# Patient Record
Sex: Male | Born: 1970 | Race: Black or African American | Hispanic: No | Marital: Married | Smoking: Current every day smoker
Health system: Southern US, Community
[De-identification: ages and names within clinical notes are randomized; demographics above are authoritative.]

## PROBLEM LIST (undated history)

## (undated) DIAGNOSIS — E119 Type 2 diabetes mellitus without complications: Secondary | ICD-10-CM

## (undated) DIAGNOSIS — I10 Essential (primary) hypertension: Secondary | ICD-10-CM

## (undated) DIAGNOSIS — E785 Hyperlipidemia, unspecified: Secondary | ICD-10-CM

## (undated) DIAGNOSIS — R0981 Nasal congestion: Secondary | ICD-10-CM

## (undated) HISTORY — DX: Nasal congestion: R09.81

## (undated) HISTORY — DX: Hyperlipidemia, unspecified: E78.5

## (undated) HISTORY — DX: Type 2 diabetes mellitus without complications: E11.9

---

## 2011-10-31 ENCOUNTER — Encounter: Payer: Self-pay | Admitting: *Deleted

## 2011-10-31 ENCOUNTER — Emergency Department (INDEPENDENT_AMBULATORY_CARE_PROVIDER_SITE_OTHER)
Admission: EM | Admit: 2011-10-31 | Discharge: 2011-10-31 | Disposition: A | Discharge status: Home / Self Care | Payer: BC Managed Care – PPO | Source: Home / Self Care | Attending: Family Medicine | Admitting: Family Medicine

## 2011-10-31 DIAGNOSIS — L918 Other hypertrophic disorders of the skin: Secondary | ICD-10-CM

## 2011-10-31 HISTORY — DX: Essential (primary) hypertension: I10

## 2011-10-31 MED ORDER — MUPIROCIN CALCIUM 2 % EX CREA: TOPICAL_CREAM | Freq: Three times a day (TID) | CUTANEOUS | Status: AC

## 2011-10-31 NOTE — ED Notes (Signed)
Pt  Reports  Symptoms  Of  A  Sore     On  r  Arm         X  Several  Months     Getting  Worse  -  The  pts   Says  It  Started  Off small  And  Became  Bigger

## 2011-10-31 NOTE — ED Provider Notes (Addendum)
History     CSN: [id number]  Arrival date & time 10/31/11  1204   First MD Initiated Contact with Patient 10/31/11 1332      Chief Complaint  Patient presents with  . Sore    (Consider location/radiation/quality/duration/timing/severity/associated sxs/prior treatment) Patient is a 41 y.o. male presenting with abscess. The history is provided by the patient.  Abscess  This is a chronic problem. Episode onset: 3 mos ago. The onset was gradual. The problem has been gradually worsening. The abscess is present on the right arm. The problem is moderate. The abscess is characterized by draining, redness and painfulness.    Past Medical History  Diagnosis Date  . Hypertension     History reviewed. No pertinent past surgical history.  History reviewed. No pertinent family history.  History  Substance Use Topics  . Smoking status: Current Everyday Smoker  . Smokeless tobacco: Not on file  . Alcohol Use: Yes      Review of Systems  Constitutional: Negative.   Skin: Positive for rash.    Allergies  Review of patient's allergies indicates not on file.  Home Medications   Current Outpatient Rx  Name Route Sig Dispense Refill  . AMLODIPINE BESY-BENAZEPRIL HCL 5-20 MG PO CAPS Oral Take 1 capsule by mouth daily.      Marland Kitchen MUPIROCIN CALCIUM 2 % EX CREA Topical Apply topically 3 (three) times daily. 15 g 0    BP 126/96  Pulse 102  Temp(Src) 97.9 F (36.6 C) (Oral)  Resp 16  SpO2 98%  Physical Exam  Nursing note and vitals reviewed. Constitutional: He appears well-developed and well-nourished.  Skin:       ED Course  Procedures (including critical care time)  Labs Reviewed - No data to display No results found.   1. Hypertrophic granulation tissue       MDM          Barkley Bruns, MD 10/31/11 1441  Barkley Bruns, MD 10/31/11 [phone]

## 2011-11-03 ENCOUNTER — Ambulatory Visit (INDEPENDENT_AMBULATORY_CARE_PROVIDER_SITE_OTHER): Payer: BC Managed Care – PPO | Admitting: Surgery

## 2011-11-03 ENCOUNTER — Encounter (INDEPENDENT_AMBULATORY_CARE_PROVIDER_SITE_OTHER): Payer: Self-pay | Admitting: Surgery

## 2011-11-03 VITALS — BP 142/96 | HR 102 | Temp 97.4°F | Ht 71.0 in | Wt 244.4 lb

## 2011-11-03 DIAGNOSIS — R229 Localized swelling, mass and lump, unspecified: Secondary | ICD-10-CM

## 2011-11-03 NOTE — Progress Notes (Signed)
Chief Complaint  Patient presents with  . Mass    mass right antecubital fossa x 3 months    HISTORY:  Patient is a 41 year old black male who presents with a mass on the skin of the lateral right antecubital fossa for approximately 3 months. This has gradually increased in size. It has become ulcerated. He desires surgical excision. Patient denies any other such lesions. He has had no prior such lesions removed. He denies any history of chronic infections.  Past Medical History  Diagnosis Date  . Hypertension   . Nasal congestion      Current Outpatient Prescriptions  Medication Sig Dispense Refill  . amLODipine-benazepril (LOTREL) 5-20 MG per capsule Take 1 capsule by mouth daily.        . cyclobenzaprine (FLEXERIL) 10 MG tablet Take 10 mg by mouth as needed.        . mupirocin cream (BACTROBAN) 2 % Apply topically 3 (three) times daily.  15 g  0     No Known Allergies   History reviewed. No pertinent family history.   History   Social History  . Marital Status: Single    Spouse Name: N/A    Number of Children: N/A  . Years of Education: N/A   Social History Main Topics  . Smoking status: Current Everyday Smoker  . Smokeless tobacco: None  . Alcohol Use: Yes  . Drug Use: No  . Sexually Active:    Other Topics Concern  . None   Social History Narrative  . None     REVIEW OF SYSTEMS - PERTINENT POSITIVES ONLY: No pain. No drainage. Gradual enlargement.   EXAM: Filed Vitals:   11/03/11 1540  BP: 142/96  Pulse: 102  Temp: 97.4 F (36.3 C)    HEENT: normocephalic; pupils equal and reactive; sclerae clear; dentition good; mucous membranes moist NECK:  No nodules; symmetric on extension; no palpable anterior or posterior cervical lymphadenopathy; no supraclavicular masses; no tenderness CHEST: clear to auscultation bilaterally without rales, rhonchi, or wheezes CARDIAC: regular rate and rhythm without significant murmur; peripheral pulses are  full EXT:  non-tender without edema; no deformity; 2 cm ulcerated mass lateral anterior right antecubital fossa NEURO: no gross focal deficits; no sign of tremor   LABORATORY RESULTS: See E-Chart for most recent results   RADIOLOGY RESULTS: See E-Chart or I-Site for most recent results   IMPRESSION: Skin and subcutaneous mass, 2 cm, lateral right anterior antecubital fossa  PLAN: The patient and I discussed this lesion. This may be some type of a typical epidermal inclusion cyst. However I cannot rule out cutaneous malignancy. I have recommended excision under sedation and local anesthesia. We will schedule this in the near future.  The risks and benefits of the procedure have been discussed at length with the patient.  The patient understands the proposed procedure, potential alternative treatments, and the course of recovery to be expected.  All of the patient's questions have been answered at this time.  The patient wishes to proceed with surgery and will schedule a date for their procedure through our office staff.   Velora Heckler, MD, FACS General & Endocrine Surgery [facility]   Visit Diagnoses: 1. Localized skin mass, lump, or swelling     Primary Care Physician: Provider Not In System

## 2011-12-18 ENCOUNTER — Telehealth (INDEPENDENT_AMBULATORY_CARE_PROVIDER_SITE_OTHER): Payer: Self-pay

## 2011-12-18 DIAGNOSIS — C44621 Squamous cell carcinoma of skin of unspecified upper limb, including shoulder: Secondary | ICD-10-CM

## 2011-12-18 HISTORY — PX: MASS EXCISION: SHX2000

## 2011-12-18 NOTE — Telephone Encounter (Signed)
Left message - Post op appointment 12/29/2011 with an 8:30am arrival.

## 2011-12-19 ENCOUNTER — Telehealth (INDEPENDENT_AMBULATORY_CARE_PROVIDER_SITE_OTHER): Payer: Self-pay

## 2011-12-19 NOTE — Telephone Encounter (Signed)
Appointment in left on voicemail.

## 2011-12-29 ENCOUNTER — Ambulatory Visit (INDEPENDENT_AMBULATORY_CARE_PROVIDER_SITE_OTHER): Payer: BC Managed Care – PPO | Admitting: Surgery

## 2011-12-29 ENCOUNTER — Encounter (INDEPENDENT_AMBULATORY_CARE_PROVIDER_SITE_OTHER): Payer: Self-pay | Admitting: Surgery

## 2011-12-29 DIAGNOSIS — C44621 Squamous cell carcinoma of skin of unspecified upper limb, including shoulder: Secondary | ICD-10-CM

## 2011-12-29 DIAGNOSIS — R229 Localized swelling, mass and lump, unspecified: Secondary | ICD-10-CM

## 2011-12-29 NOTE — Progress Notes (Signed)
Visit Diagnoses: 1. Localized skin mass, lump, or swelling   2. Squamous cell cancer of skin of forearm     HISTORY: The patient returns for first postoperative visit having undergone excision of a cutaneous lesion from the right antecubital fossa. Final pathology shows a well differentiated squamous cell carcinoma with negative margins.  EXAM: Wound is healing uneventfully. Sutures are removed. Benzoin and Steri-Strips are applied. There is no sign of infection.  IMPRESSION: Well differentiated squamous cell carcinoma  PLAN: Pathology results are shared with the patient. These are certainly a surprise. I will review them with one of the dermatologists to see if any further evaluation is indicated. Given the fact that the tumor is well differentiated and that the margins are negative, he may require only close followup.  Patient will return in 6 weeks for wound check.  Velora Heckler, MD, FACS General & Endocrine Surgery [facility]

## 2011-12-29 NOTE — Patient Instructions (Signed)
After steristrips come off:   COCOA BUTTER & VITAMIN E CREAM  ([employer] or other brand)  Apply cocoa butter/vitamin E cream to your incision 2 - 3 times daily.  Massage cream into incision for one minute with each application.  Use sunscreen (50 SPF or higher) for first 6 months after surgery if area is exposed to sun.  You may substitute Mederma or other scar reducing creams as desired.

## 2012-01-01 ENCOUNTER — Encounter (INDEPENDENT_AMBULATORY_CARE_PROVIDER_SITE_OTHER): Payer: Self-pay | Admitting: Surgery

## 2012-02-16 ENCOUNTER — Encounter (INDEPENDENT_AMBULATORY_CARE_PROVIDER_SITE_OTHER): Payer: BC Managed Care – PPO | Admitting: Surgery

## 2012-02-25 ENCOUNTER — Encounter (INDEPENDENT_AMBULATORY_CARE_PROVIDER_SITE_OTHER): Payer: Self-pay | Admitting: Surgery

## 2012-04-15 ENCOUNTER — Encounter (INDEPENDENT_AMBULATORY_CARE_PROVIDER_SITE_OTHER): Payer: Self-pay

## 2013-02-15 ENCOUNTER — Other Ambulatory Visit: Payer: Self-pay | Admitting: Neurosurgery

## 2013-02-15 DIAGNOSIS — M545 Low back pain, unspecified: Secondary | ICD-10-CM

## 2013-02-17 ENCOUNTER — Ambulatory Visit
Admission: RE | Admit: 2013-02-17 | Discharge: 2013-02-17 | Disposition: A | Discharge status: Home / Self Care | Payer: BC Managed Care – PPO | Source: Ambulatory Visit | Attending: Neurosurgery | Admitting: Neurosurgery

## 2013-02-17 DIAGNOSIS — M545 Low back pain, unspecified: Secondary | ICD-10-CM

## 2014-12-14 ENCOUNTER — Other Ambulatory Visit: Payer: Self-pay | Admitting: Family Medicine

## 2014-12-15 ENCOUNTER — Other Ambulatory Visit: Payer: Self-pay | Admitting: Family Medicine

## 2014-12-15 DIAGNOSIS — L97509 Non-pressure chronic ulcer of other part of unspecified foot with unspecified severity: Secondary | ICD-10-CM

## 2014-12-22 ENCOUNTER — Ambulatory Visit
Admission: RE | Admit: 2014-12-22 | Discharge: 2014-12-22 | Disposition: A | Discharge status: Home / Self Care | Payer: BLUE CROSS/BLUE SHIELD | Source: Ambulatory Visit | Attending: Family Medicine | Admitting: Family Medicine

## 2014-12-22 DIAGNOSIS — L97509 Non-pressure chronic ulcer of other part of unspecified foot with unspecified severity: Secondary | ICD-10-CM

## 2015-01-03 ENCOUNTER — Encounter (HOSPITAL_BASED_OUTPATIENT_CLINIC_OR_DEPARTMENT_OTHER): Payer: Self-pay | Attending: Surgery

## 2017-01-01 DIAGNOSIS — B009 Herpesviral infection, unspecified: Secondary | ICD-10-CM | POA: Diagnosis not present

## 2017-01-01 DIAGNOSIS — I10 Essential (primary) hypertension: Secondary | ICD-10-CM | POA: Diagnosis not present

## 2017-01-01 DIAGNOSIS — F17200 Nicotine dependence, unspecified, uncomplicated: Secondary | ICD-10-CM | POA: Diagnosis not present

## 2017-04-06 DIAGNOSIS — I10 Essential (primary) hypertension: Secondary | ICD-10-CM | POA: Diagnosis not present

## 2017-04-06 DIAGNOSIS — R0781 Pleurodynia: Secondary | ICD-10-CM | POA: Diagnosis not present

## 2017-04-06 DIAGNOSIS — F17210 Nicotine dependence, cigarettes, uncomplicated: Secondary | ICD-10-CM | POA: Diagnosis not present

## 2017-06-08 DIAGNOSIS — F17210 Nicotine dependence, cigarettes, uncomplicated: Secondary | ICD-10-CM | POA: Diagnosis not present

## 2017-06-08 DIAGNOSIS — I10 Essential (primary) hypertension: Secondary | ICD-10-CM | POA: Diagnosis not present

## 2017-11-30 DIAGNOSIS — Z125 Encounter for screening for malignant neoplasm of prostate: Secondary | ICD-10-CM | POA: Diagnosis not present

## 2017-11-30 DIAGNOSIS — R0982 Postnasal drip: Secondary | ICD-10-CM | POA: Diagnosis not present

## 2017-11-30 DIAGNOSIS — F17210 Nicotine dependence, cigarettes, uncomplicated: Secondary | ICD-10-CM | POA: Diagnosis not present

## 2017-11-30 DIAGNOSIS — Z13220 Encounter for screening for lipoid disorders: Secondary | ICD-10-CM | POA: Diagnosis not present

## 2017-11-30 DIAGNOSIS — R400 Somnolence: Secondary | ICD-10-CM | POA: Diagnosis not present

## 2017-11-30 DIAGNOSIS — I10 Essential (primary) hypertension: Secondary | ICD-10-CM | POA: Diagnosis not present

## 2018-01-08 DIAGNOSIS — Z0000 Encounter for general adult medical examination without abnormal findings: Secondary | ICD-10-CM | POA: Diagnosis not present

## 2018-02-03 DIAGNOSIS — R0683 Snoring: Secondary | ICD-10-CM | POA: Diagnosis not present

## 2018-02-03 DIAGNOSIS — G4719 Other hypersomnia: Secondary | ICD-10-CM | POA: Diagnosis not present

## 2018-02-03 DIAGNOSIS — R0681 Apnea, not elsewhere classified: Secondary | ICD-10-CM | POA: Diagnosis not present

## 2018-03-15 DIAGNOSIS — M25512 Pain in left shoulder: Secondary | ICD-10-CM | POA: Diagnosis not present

## 2018-03-15 DIAGNOSIS — M542 Cervicalgia: Secondary | ICD-10-CM | POA: Diagnosis not present

## 2018-03-15 DIAGNOSIS — G4733 Obstructive sleep apnea (adult) (pediatric): Secondary | ICD-10-CM | POA: Diagnosis not present

## 2018-03-30 DIAGNOSIS — M542 Cervicalgia: Secondary | ICD-10-CM | POA: Diagnosis not present

## 2018-04-22 DIAGNOSIS — M5412 Radiculopathy, cervical region: Secondary | ICD-10-CM | POA: Diagnosis not present

## 2018-04-22 DIAGNOSIS — M542 Cervicalgia: Secondary | ICD-10-CM | POA: Diagnosis not present

## 2018-04-23 DIAGNOSIS — M4712 Other spondylosis with myelopathy, cervical region: Secondary | ICD-10-CM | POA: Diagnosis not present

## 2018-04-26 ENCOUNTER — Other Ambulatory Visit: Payer: Self-pay | Admitting: Family Medicine

## 2018-04-26 DIAGNOSIS — G959 Disease of spinal cord, unspecified: Secondary | ICD-10-CM

## 2018-05-01 ENCOUNTER — Ambulatory Visit
Admission: RE | Admit: 2018-05-01 | Discharge: 2018-05-01 | Disposition: A | Discharge status: Home / Self Care | Payer: BLUE CROSS/BLUE SHIELD | Source: Ambulatory Visit | Attending: Family Medicine | Admitting: Family Medicine

## 2018-05-01 DIAGNOSIS — M50121 Cervical disc disorder at C4-C5 level with radiculopathy: Secondary | ICD-10-CM | POA: Diagnosis not present

## 2018-05-01 DIAGNOSIS — G959 Disease of spinal cord, unspecified: Secondary | ICD-10-CM

## 2018-05-14 DIAGNOSIS — F17200 Nicotine dependence, unspecified, uncomplicated: Secondary | ICD-10-CM | POA: Diagnosis not present

## 2018-05-14 DIAGNOSIS — I10 Essential (primary) hypertension: Secondary | ICD-10-CM | POA: Diagnosis not present

## 2018-05-14 DIAGNOSIS — G4733 Obstructive sleep apnea (adult) (pediatric): Secondary | ICD-10-CM | POA: Diagnosis not present

## 2018-06-02 DIAGNOSIS — M542 Cervicalgia: Secondary | ICD-10-CM | POA: Diagnosis not present

## 2018-06-02 DIAGNOSIS — Z6833 Body mass index (BMI) 33.0-33.9, adult: Secondary | ICD-10-CM | POA: Diagnosis not present

## 2018-06-02 DIAGNOSIS — M5412 Radiculopathy, cervical region: Secondary | ICD-10-CM | POA: Diagnosis not present

## 2018-06-02 DIAGNOSIS — M5030 Other cervical disc degeneration, unspecified cervical region: Secondary | ICD-10-CM | POA: Diagnosis not present

## 2018-07-01 DIAGNOSIS — Z3009 Encounter for other general counseling and advice on contraception: Secondary | ICD-10-CM | POA: Diagnosis not present

## 2018-07-08 DIAGNOSIS — Z113 Encounter for screening for infections with a predominantly sexual mode of transmission: Secondary | ICD-10-CM | POA: Diagnosis not present

## 2018-07-08 DIAGNOSIS — M25562 Pain in left knee: Secondary | ICD-10-CM | POA: Diagnosis not present

## 2018-07-26 DIAGNOSIS — M5412 Radiculopathy, cervical region: Secondary | ICD-10-CM | POA: Diagnosis not present

## 2018-07-26 DIAGNOSIS — M542 Cervicalgia: Secondary | ICD-10-CM | POA: Diagnosis not present

## 2018-11-08 DIAGNOSIS — M5412 Radiculopathy, cervical region: Secondary | ICD-10-CM | POA: Diagnosis not present

## 2018-11-09 DIAGNOSIS — T781XXD Other adverse food reactions, not elsewhere classified, subsequent encounter: Secondary | ICD-10-CM | POA: Diagnosis not present

## 2018-11-09 DIAGNOSIS — B353 Tinea pedis: Secondary | ICD-10-CM | POA: Diagnosis not present

## 2018-11-09 DIAGNOSIS — J309 Allergic rhinitis, unspecified: Secondary | ICD-10-CM | POA: Diagnosis not present

## 2018-11-18 DIAGNOSIS — I10 Essential (primary) hypertension: Secondary | ICD-10-CM | POA: Diagnosis not present

## 2018-11-18 DIAGNOSIS — M5412 Radiculopathy, cervical region: Secondary | ICD-10-CM | POA: Diagnosis not present

## 2018-11-18 DIAGNOSIS — G4733 Obstructive sleep apnea (adult) (pediatric): Secondary | ICD-10-CM | POA: Diagnosis not present

## 2018-11-18 DIAGNOSIS — F17200 Nicotine dependence, unspecified, uncomplicated: Secondary | ICD-10-CM | POA: Diagnosis not present

## 2018-11-26 DIAGNOSIS — M542 Cervicalgia: Secondary | ICD-10-CM | POA: Diagnosis not present

## 2018-11-26 DIAGNOSIS — M5412 Radiculopathy, cervical region: Secondary | ICD-10-CM | POA: Diagnosis not present

## 2018-11-26 DIAGNOSIS — Z6832 Body mass index (BMI) 32.0-32.9, adult: Secondary | ICD-10-CM | POA: Diagnosis not present

## 2018-12-16 DIAGNOSIS — L03119 Cellulitis of unspecified part of limb: Secondary | ICD-10-CM | POA: Diagnosis not present

## 2018-12-16 DIAGNOSIS — I10 Essential (primary) hypertension: Secondary | ICD-10-CM | POA: Diagnosis not present

## 2018-12-16 DIAGNOSIS — B353 Tinea pedis: Secondary | ICD-10-CM | POA: Diagnosis not present

## 2018-12-16 DIAGNOSIS — N529 Male erectile dysfunction, unspecified: Secondary | ICD-10-CM | POA: Diagnosis not present

## 2019-02-15 DIAGNOSIS — I10 Essential (primary) hypertension: Secondary | ICD-10-CM | POA: Diagnosis not present

## 2019-02-15 DIAGNOSIS — L03119 Cellulitis of unspecified part of limb: Secondary | ICD-10-CM | POA: Diagnosis not present

## 2019-02-15 DIAGNOSIS — B353 Tinea pedis: Secondary | ICD-10-CM | POA: Diagnosis not present

## 2019-02-15 DIAGNOSIS — N529 Male erectile dysfunction, unspecified: Secondary | ICD-10-CM | POA: Diagnosis not present

## 2019-02-22 DIAGNOSIS — L03119 Cellulitis of unspecified part of limb: Secondary | ICD-10-CM | POA: Diagnosis not present

## 2019-02-22 DIAGNOSIS — N529 Male erectile dysfunction, unspecified: Secondary | ICD-10-CM | POA: Diagnosis not present

## 2019-02-22 DIAGNOSIS — I10 Essential (primary) hypertension: Secondary | ICD-10-CM | POA: Diagnosis not present

## 2019-02-22 DIAGNOSIS — B353 Tinea pedis: Secondary | ICD-10-CM | POA: Diagnosis not present

## 2019-06-03 DIAGNOSIS — L03119 Cellulitis of unspecified part of limb: Secondary | ICD-10-CM | POA: Diagnosis not present

## 2019-06-03 DIAGNOSIS — I10 Essential (primary) hypertension: Secondary | ICD-10-CM | POA: Diagnosis not present

## 2019-06-03 DIAGNOSIS — B353 Tinea pedis: Secondary | ICD-10-CM | POA: Diagnosis not present

## 2019-06-03 DIAGNOSIS — K1120 Sialoadenitis, unspecified: Secondary | ICD-10-CM | POA: Diagnosis not present

## 2020-04-01 IMAGING — MR MR CERVICAL SPINE W/O CM
5 series · 29 of 48 positions shown · non-contrast
Comparison: None.

CLINICAL DATA: Neck pain radiating into the left arm. Numbness from
the left shoulder to the arm. Symptoms for 2 months.

EXAM:
MRI CERVICAL SPINE WITHOUT CONTRAST
TECHNIQUE: Multiplanar, multisequence MR imaging of the cervical spine was
performed. No intravenous contrast was administered.

[Series 2: T2 · sagittal · 3.0mm · 0.41mm/px · 6 of 13 slices shown (1 of 2)]
[im 1/13]
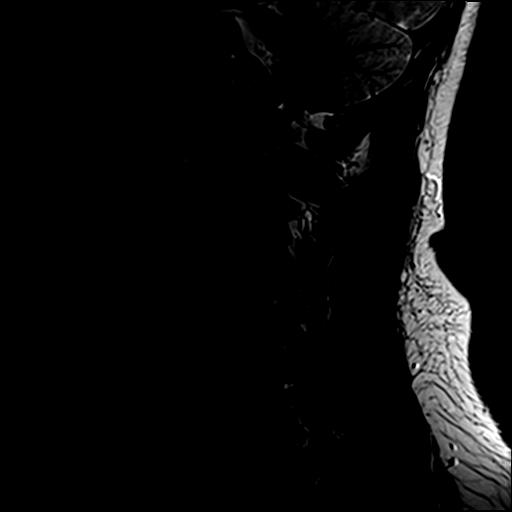
[im 3/13]
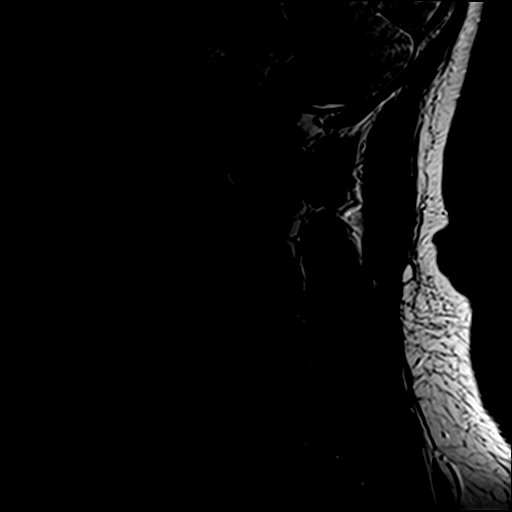
[im 5/13]
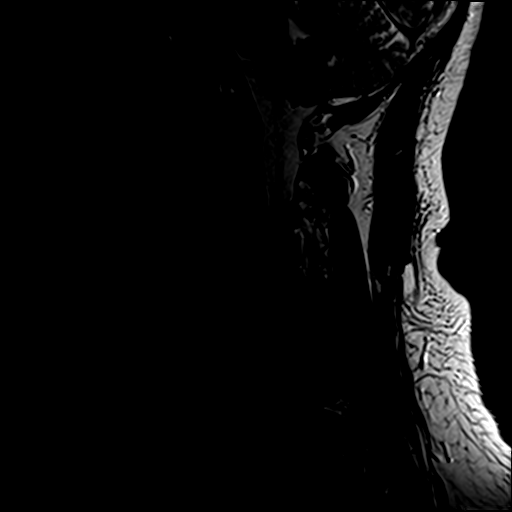
[im 8/13]
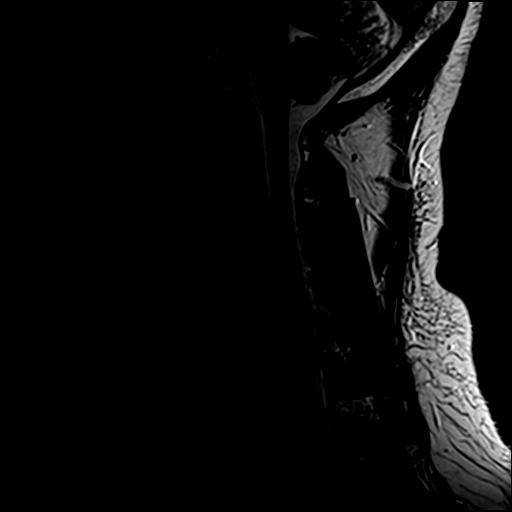
[im 10/13]
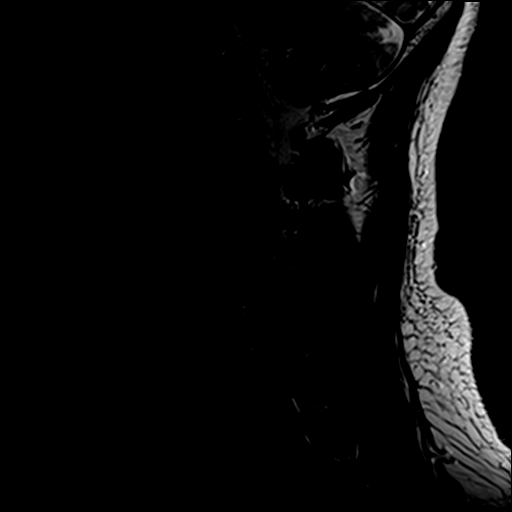
[im 13/13]
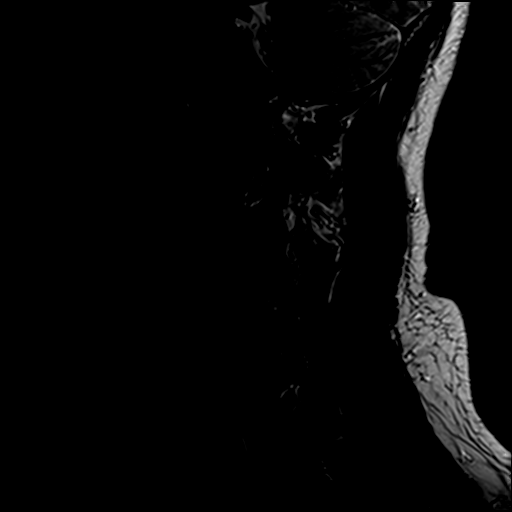

[Series 3: T1 · sagittal · 3.0mm · 0.41mm/px · 7 of 13 slices shown]
[im 1/13]
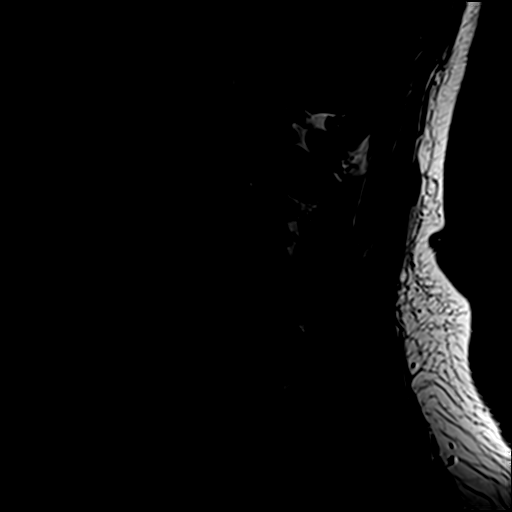
[im 3/13]
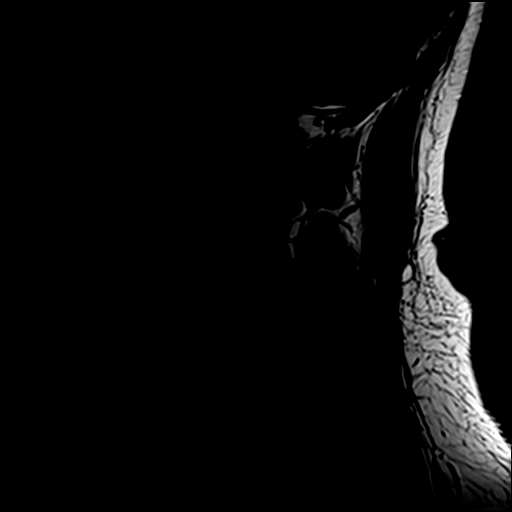
[im 5/13]
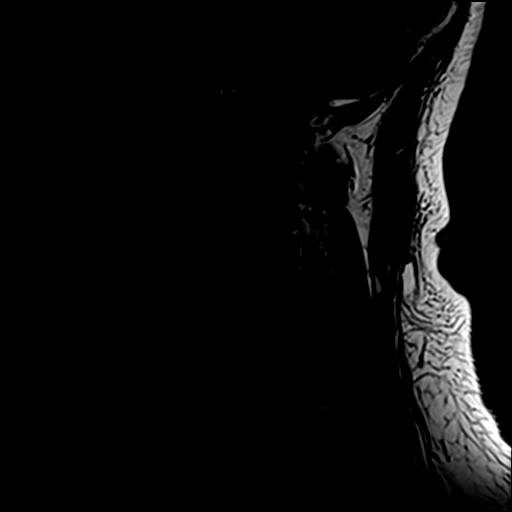
[im 7/13]
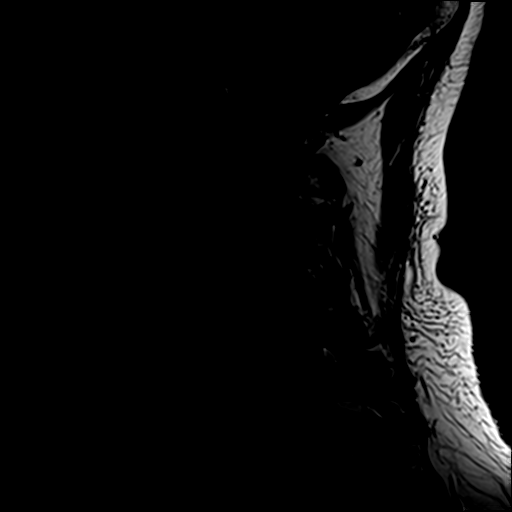
[im 9/13]
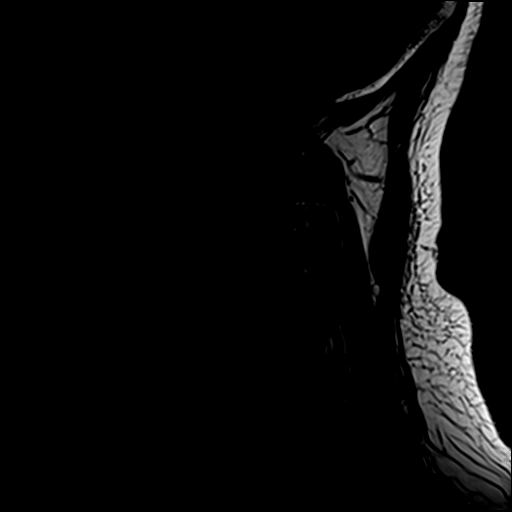
[im 11/13]
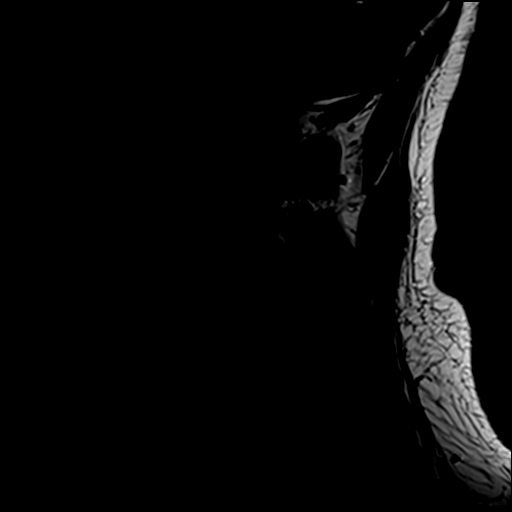
[im 13/13]
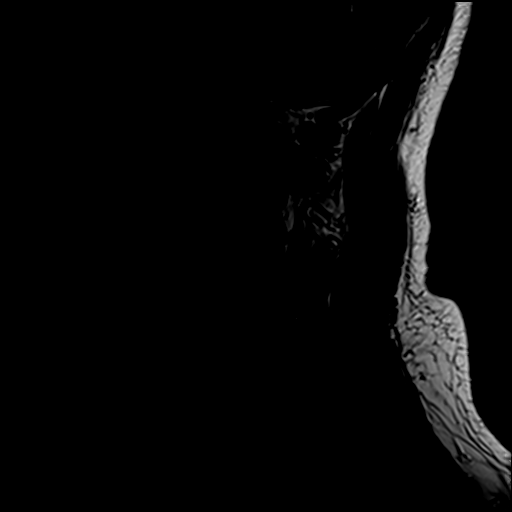

[Series 4: STIR · sagittal · 3.0mm · 0.82mm/px · 7 of 13 slices shown]
[im 1/13]
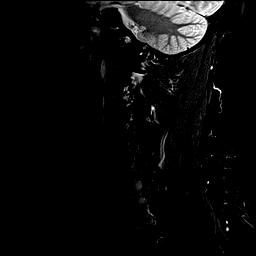
[im 3/13]
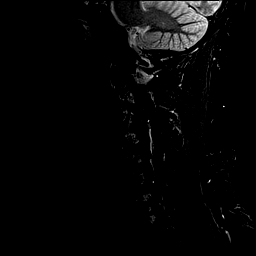
[im 5/13]
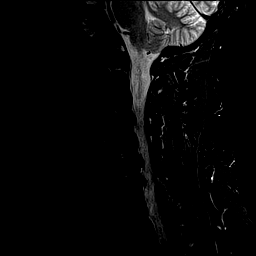
[im 7/13]
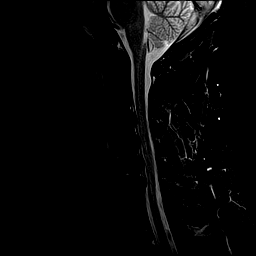
[im 9/13]
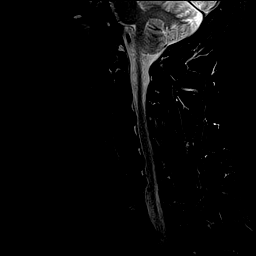
[im 11/13]
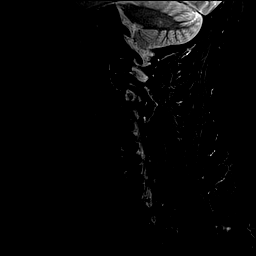
[im 13/13]
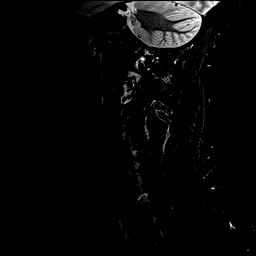

[Series 5: GRE · axial · 3.0mm · 0.35mm/px · 1 of 28 slices shown]
[im 1/28]
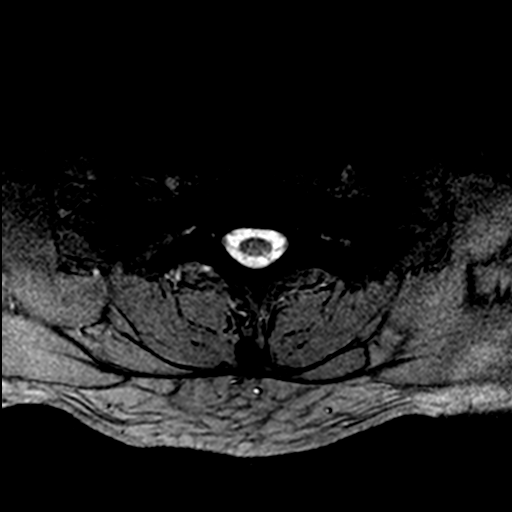

[Series 6: T2 · axial · 3.0mm · 0.70mm/px · z∈[-42,+60]mm · 8 of 28 slices shown (2 of 2)]
[im 1/28]
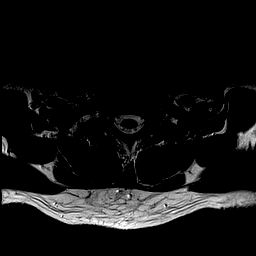
[im 5/28]
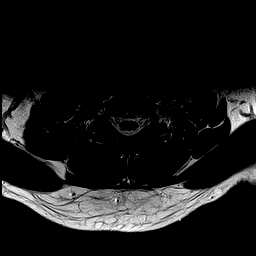
[im 9/28]
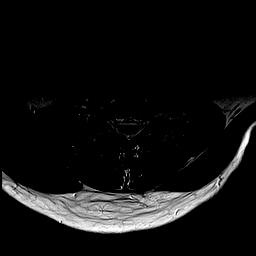
[im 13/28]
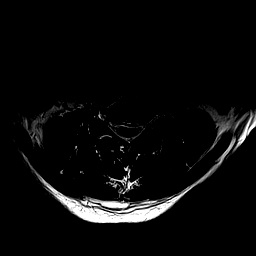
[im 15/28]
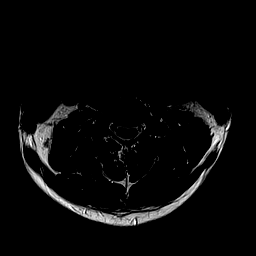
[im 19/28]
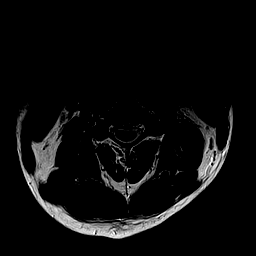
[im 23/28]
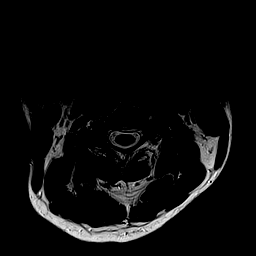
[im 28/28]
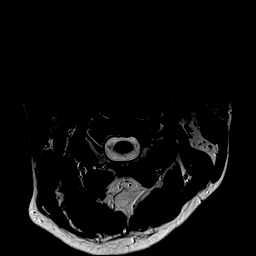

[29 of 48 positions shown; findings below may reference images not displayed]

FINDINGS: Alignment: There is straightening of the normal cervical lordosis.
No listhesis.

Vertebrae: No fracture or worrisome lesion.

Cord: Normal signal throughout.

Posterior Fossa, vertebral arteries, paraspinal tissues: Negative.

Disc levels:

C2-3: Negative.

C3-4: There is posterior bony ridging and left worse than right
uncovertebral disease. The central canal is open. Moderate to
moderately severe foraminal narrowing is worse on the left.

C4-5: Shallow broad-based disc bulge and uncovertebral disease on
the left. The ventral thecal sac is effaced. Moderate to moderately
severe left foraminal narrowing is identified. The right foramen is
open.

C5-6: Disc bulge with a superimposed small left paracentral
protrusion and left greater than right uncovertebral disease. Disc
contacts the ventral cord without deforming it. Mild to moderate
foraminal narrowing is worse on the left.

C6-7: Broad-based disc bulge and bilateral uncovertebral disease are
seen. The ventral thecal sac is effaced. Moderate bilateral
foraminal narrowing is identified.

C7-T1: Negative.
IMPRESSION: Uncovertebral disease causes moderate to moderately severe foraminal
narrowing at C3-4, worse on the left.

Disc bulge at C4-5 effaces the ventral thecal sac. Moderate to
moderately severe left foraminal narrowing is present at this level.

Disc bulge and a small left paracentral protrusion at C5-6 contacts
the ventral cord without deforming the cord. Mild to moderate
foraminal narrowing at this level is worse on the left.

Moderate bilateral foraminal narrowing at C6-7 where a disc bulge
effaces the ventral thecal sac.

## 2022-01-21 ENCOUNTER — Other Ambulatory Visit: Payer: Self-pay

## 2022-01-21 ENCOUNTER — Other Ambulatory Visit: Payer: Self-pay | Admitting: Family Medicine

## 2022-01-21 ENCOUNTER — Ambulatory Visit
Admission: RE | Admit: 2022-01-21 | Discharge: 2022-01-21 | Disposition: A | Discharge status: Home / Self Care | Payer: Self-pay | Source: Ambulatory Visit | Attending: Family Medicine | Admitting: Family Medicine

## 2022-01-21 DIAGNOSIS — R0789 Other chest pain: Secondary | ICD-10-CM

## 2022-01-28 ENCOUNTER — Ambulatory Visit: Payer: Commercial Managed Care - HMO | Admitting: Cardiology

## 2022-02-10 ENCOUNTER — Encounter: Payer: Self-pay | Admitting: Cardiology

## 2022-02-10 ENCOUNTER — Ambulatory Visit: Payer: Commercial Managed Care - HMO

## 2022-02-10 ENCOUNTER — Ambulatory Visit: Payer: Commercial Managed Care - HMO | Admitting: Cardiology

## 2022-02-10 VITALS — BP 140/92 | HR 82 | Temp 98.0°F | Resp 16 | Ht 71.0 in | Wt 271.0 lb

## 2022-02-10 DIAGNOSIS — I10 Essential (primary) hypertension: Secondary | ICD-10-CM

## 2022-02-10 DIAGNOSIS — R0683 Snoring: Secondary | ICD-10-CM

## 2022-02-10 DIAGNOSIS — R072 Precordial pain: Secondary | ICD-10-CM

## 2022-02-10 DIAGNOSIS — E119 Type 2 diabetes mellitus without complications: Secondary | ICD-10-CM

## 2022-02-10 DIAGNOSIS — F17210 Nicotine dependence, cigarettes, uncomplicated: Secondary | ICD-10-CM

## 2022-02-10 DIAGNOSIS — E1169 Type 2 diabetes mellitus with other specified complication: Secondary | ICD-10-CM

## 2022-02-10 NOTE — Progress Notes (Signed)
? ?ID:  John Nolan, DOB April 10, 1971, MRN [id number] ? ?PCP:  Vernie Shanks, MD  ?Cardiologist:  Rex Kras, DO, [facility] (established care 02/10/2022) ? ?REASON FOR CONSULT: Chest Pain  ? ?REQUESTING PHYSICIAN:  ?Vernie Shanks, MD ?[place],  [place] [address] ? ?Chief Complaint  ?Patient presents with  ? Chest Pain  ? New Patient (Initial Visit)  ? ? 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 ? ?He is referred to the office at the request of Vernie Shanks, MD for evaluation of chest pain. ? ?Precordial discomfort: ?Patient has been having chest pain for the last 1 month, located substernally/left-sided, nonexertional, does not resolve with rest, self-limited.  The discomfort is intermittent and at times brought on by palpation or positional turning side-to-side.  No recent injuries or muscle strain.  No change in overall physical endurance.  He would not for a walk with his wife yesterday for 1.5 miles without any discomfort.  Given his risk factors as mentioned above he is referred to cardiology for further evaluation and management. ? ?He denies symptoms of heart failure. ? ?Home BP are better; he was out of town for the last 2 days and forgot to take his medications. Otherwise at home SBP around 168mHG.  ? ?No family history of premature coronary artery disease, sudden cardiac death, or cardiomyopathy. ? ?FUNCTIONAL STATUS: ?No structured exercise program or daily routine.  ? ?ALLERGIES: ?No Known Allergies ? ?MEDICATION LIST PRIOR TO VISIT: ?Current Meds  ?Medication Sig  ? amLODipine-benazepril (LOTREL) 5-20 MG per capsule Take 1 capsule by mouth daily.    ? cetirizine (ZYRTEC) 10 MG tablet 1 tablet  ? hydrochlorothiazide (HYDRODIURIL) 25 MG tablet Take 1 tablet by mouth daily.  ? metFORMIN  (GLUCOPHAGE-XR) 500 MG 24 hr tablet Take 500 mg by mouth daily.  ? Multiple Vitamins-Minerals (MENS MULTIVITAMIN) TABS 1 tablet  ? naproxen sodium (ALEVE) 220 MG tablet 1 tablet as needed  ? rosuvastatin (CRESTOR) 20 MG tablet Take 20 mg by mouth daily.  ? valACYclovir (VALTREX) 500 MG tablet Take 500 mg by mouth as needed.  ?  ? ?PAST MEDICAL HISTORY: ?Past Medical History:  ?Diagnosis Date  ? Diabetes mellitus without complication ([place]   ? Hyperlipidemia   ? Hypertension   ? Nasal congestion   ? ? ?PAST SURGICAL HISTORY: ?Past Surgical History:  ?Procedure Laterality Date  ? MASS EXCISION  12/18/11  ? right antecubital fossa  ? ? ?FAMILY HISTORY: ?No family history of premature coronary disease or sudden cardiac death. ? ?SOCIAL HISTORY:  ?The patient  reports that he has been smoking cigarettes. He has been smoking an average of .5 packs per day. He has never used smokeless tobacco. He reports current alcohol use. He reports that he does not use drugs. ? ?REVIEW OF SYSTEMS: ?Review of Systems  ?Cardiovascular:  Positive for chest pain. Negative for dyspnea on exertion, leg swelling, near-syncope, orthopnea, palpitations, paroxysmal nocturnal dyspnea and syncope.  ?Respiratory:  Positive for snoring. Negative for shortness of breath.   ? ?PHYSICAL EXAM: ? ?  02/10/2022  ?  9:05 AM 02/10/2022  ?  8:57 AM 12/29/2011  ?  9:38 AM  ?Vitals with BMI  ?Height  '5\' 11"'$  '5\' 11"'$   ?Weight  271 lbs 239 lbs 6 oz  ?BMI  37.81 33.5  ?Systolic 1735  140 162  ?Diastolic 92 97 98  ?Pulse 82 82 72  ? ? ?CONSTITUTIONAL: Well-developed and well-nourished. No acute distress.  ?SKIN: Skin is warm and dry. No rash noted. No cyanosis. No pallor. No jaundice ?HEAD: Normocephalic and atraumatic.  ?EYES: No scleral icterus ?MOUTH/THROAT: Moist oral membranes.  ?NECK: No JVD present. No thyromegaly noted. No carotid bruits  ?LYMPHATIC: No visible cervical adenopathy.  ?CHEST Normal respiratory effort. No intercostal retractions  ?LUNGS: Clear to  auscultation bilaterally.  No stridor. No wheezes. No rales.  ?CARDIOVASCULAR: Regular rate and rhythm, positive S1-S2, no murmurs rubs or gallops appreciated. ?ABDOMINAL: Obese, soft, nontender, nondistended, positive bowel sounds all 4 quadrants. No apparent ascites.  ?EXTREMITIES: No peripheral edema, warm to touch, +2 bilateral DP and PT pulses ?HEMATOLOGIC: No significant bruising ?NEUROLOGIC: Oriented to person, place, and time. Nonfocal. Normal muscle tone.  ?PSYCHIATRIC: Normal mood and affect. Normal behavior. Cooperative ? ?CARDIAC DATABASE: ?EKG: ?02/10/2022: Normal sinus rhythm, 81 bpm, poor R wave progression, without underlying ischemia injury pattern. ? ?Echocardiogram: ?No results found for this or any previous visit from the past 1095 days. ?  ? ?Stress Testing: ?No results found for this or any previous visit from the past 1095 days. ? ? ?Heart Catheterization: ?None ? ?LABORATORY DATA: ?Hemoglobin A1c   2022-01-21    ?eAG 143      ?Hgb A1c 6.6   4.8-5.6  ?CBC with Diff   2022-01-21    ?BA# 0.0   0.0-0.1  ?BA% 1.0   0.0-1.0  ?EO# 0.4   0.0-0.6  ?EO% 8.0   0.0-7.8  ?HCT 43.6   39.0-52.0  ?HGB 14.3   13.0-17.0  ?LY# 1.60   1.10-2.70  ?LY% 33.7   16.8-43.5  ?MCH 24.4   27.0-33.0  ?MCHC 32.7   32.0-36.0  ?MCV 74.7   80.0-94.0  ?MO# 0.6   0.3-0.8  ?MO% 12.5   4.6-12.4  ?MPV 8.7   7.5-10.7  ?NE# 2.1   1.9-7.2  ?NE% 44.8   43.3-71.9  ?NRBC# 0.03      ?NRBC% 0.50      ?PLT 165   150-400  ?RBC 5.84   4.20-5.80  ?RDW 15.8   11.5-15.5  ?WBC 4.6   4.0-11.0  ?Comp Metabolic Panel   [id number]    ?Albumin 4.5   3.4-4.8  ?ALP 76   38-126  ?ALT 27   0-52  ?Anion Gap 12.5   6.0-20.0  ?AST 23   0-39  ?BUN 16   6-26  ?CO2 28   22-32  ?CA-corrected 9.33   8.60-10.30  ?Calcium 9.8   8.6-10.3  ?Chloride 105   98-107  ?Creatinine 1.08   0.60-1.30  ?[id number] 84   >60  ?Glucose 107   70-99  ?Potassium 4.3   3.5-5.5  ?Sodium 141   136-145  ?Protein, Total 7.4   6.0-8.3  ?TBIL 0.4   0.3-1.0  ?Lipid Panel w/reflex   2022-01-21     ?LDL Chol Calc (NIH) 37   0-99  ?CHOL/HDL 3.6   2.0-4.0  ?Cholesterol 103   <200  ?HDLD 29   30-70  ?LDL Chol Calc (NIH) 37   0-99  ?NHDL 74   0-129  ?Triglyceride 235   0-199  ? ? ?IMPRESSION: ? ?  ICD-10-CM   ?1. Precordial pain  R07.2 EKG 12-Lead  ?  PCV ECHOCARDIOGRAM COMPLETE  ?  PCV CARDIAC STRESS TEST  ?  ?2. Snoring  R06.83 Ambulatory referral to Sleep Studies  ?  ?3. Non-insulin  dependent type 2 diabetes mellitus ([place])  E11.9   ?  ?4. Benign hypertension  I10   ?  ?5. Type 2 diabetes mellitus with hyperlipidemia (HCC)  E11.69   ? E78.5   ?  ?6. Cigarette smoker  F17.210   ?  ?  ? ?RECOMMENDATIONS: ?John Nolan is a 51 y.o. African-American male whose past medical history and cardiac risk factors include: Hypertension, cigarette smoker, OSA, non-insulin-dependent diabetes mellitus type 2, hyperlipidemia. ? ?Precordial pain ?Symptom suggestive of noncardiac discomfort. ?However has multiple cardiovascular risk factors as outlined above and therefore recommend an echocardiogram and exercise treadmill stress test to further evaluate for structural heart disease, functional status, exercise-induced ischemia. ?Educated on importance of improving his modifiable cardiovascular risk factors: ?Home blood pressure reported to be within normal limits. ?A1c well controlled. ?LDL well controlled. ?Educated on importance of complete smoking cessation. ?He has a formal diagnosis of OSA based on the records provided by PCP but currently not being treated.  He complains of nocturnal snoring and breathing difficulties at night per his wife.  We will refer him to sleep medicine for further evaluation and management. ? ?Snoring ?Referred to ambulatory sleep medicine for evaluation of sleep apnea. ? ?Non-insulin dependent type 2 diabetes mellitus ([place]) ?Hemoglobin A1c well controlled. ?Currently on medical therapy. ?Currently managed by primary care provider. ? ?Benign hypertension ?Office blood pressures are not well  controlled. ?Patient forgot to take 2 days worth of medication as he was out of town for a funeral. ?Patient states that his home blood pressures are better controlled. ?Currently managed by PCP.  Medications rec

## 2022-03-29 ENCOUNTER — Emergency Department (HOSPITAL_COMMUNITY)
Admission: EM | Admit: 2022-03-29 | Discharge: 2022-03-29 | Disposition: A | Discharge status: Home / Self Care | Payer: Commercial Managed Care - HMO | Attending: Emergency Medicine | Admitting: Emergency Medicine

## 2022-03-29 ENCOUNTER — Emergency Department (HOSPITAL_COMMUNITY): Payer: Commercial Managed Care - HMO

## 2022-03-29 ENCOUNTER — Other Ambulatory Visit: Payer: Self-pay

## 2022-03-29 DIAGNOSIS — I10 Essential (primary) hypertension: Secondary | ICD-10-CM | POA: Diagnosis not present

## 2022-03-29 DIAGNOSIS — L03213 Periorbital cellulitis: Secondary | ICD-10-CM

## 2022-03-29 DIAGNOSIS — H5712 Ocular pain, left eye: Secondary | ICD-10-CM | POA: Diagnosis present

## 2022-03-29 DIAGNOSIS — H05012 Cellulitis of left orbit: Secondary | ICD-10-CM | POA: Diagnosis not present

## 2022-03-29 DIAGNOSIS — E119 Type 2 diabetes mellitus without complications: Secondary | ICD-10-CM | POA: Diagnosis not present

## 2022-03-29 DIAGNOSIS — Z7984 Long term (current) use of oral hypoglycemic drugs: Secondary | ICD-10-CM | POA: Insufficient documentation

## 2022-03-29 LAB — BASIC METABOLIC PANEL
Anion gap: 11 (ref 5–15)
BUN: 13 mg/dL (ref 6–20)
CO2: 25 mmol/L (ref 22–32)
Calcium: 9.5 mg/dL (ref 8.9–10.3)
Chloride: 102 mmol/L (ref 98–111)
Creatinine, Ser: 1.02 mg/dL (ref 0.61–1.24)
GFR, Estimated: >60 mL/min (ref >60)
Glucose, Bld: 153 mg/dL — ABNORMAL HIGH (ref 70–99)
Potassium: 3.5 mmol/L (ref 3.5–5.1)
Sodium: 138 mmol/L (ref 135–145)

## 2022-03-29 LAB — CBC WITH DIFFERENTIAL/PLATELET
Abs Immature Granulocytes: 0.01 10*3/uL (ref 0.00–0.07)
Basophils Absolute: 0 10*3/uL (ref 0.0–0.1)
Basophils Relative: 1 %
Eosinophils Absolute: 0.2 10*3/uL (ref 0.0–0.5)
Eosinophils Relative: 5 %
HCT: 44.9 % (ref 39.0–52.0)
Hemoglobin: 14.6 g/dL (ref 13.0–17.0)
Immature Granulocytes: 0 %
Lymphocytes Relative: 33 %
Lymphs Abs: 1.5 10*3/uL (ref 0.7–4.0)
MCH: 24.5 pg — ABNORMAL LOW (ref 26.0–34.0)
MCHC: 32.5 g/dL (ref 30.0–36.0)
MCV: 75.5 fL — ABNORMAL LOW (ref 80.0–100.0)
Monocytes Absolute: 0.5 10*3/uL (ref 0.1–1.0)
Monocytes Relative: 10 %
Neutro Abs: 2.4 10*3/uL (ref 1.7–7.7)
Neutrophils Relative %: 51 %
Platelets: 190 10*3/uL (ref 150–400)
RBC: 5.95 MIL/uL — ABNORMAL HIGH (ref 4.22–5.81)
RDW: 16.6 % — ABNORMAL HIGH (ref 11.5–15.5)
WBC: 4.6 10*3/uL (ref 4.0–10.5)
nRBC: 0 % (ref 0.0–0.2)

## 2022-03-29 MED ORDER — LACTATED RINGERS IV BOLUS
500.0000 mL | Freq: Once | INTRAVENOUS | Status: AC
  Administered 2022-03-29: 500 mL via INTRAVENOUS

## 2022-03-29 MED ORDER — IOHEXOL 350 MG/ML SOLN
100.0000 mL | Freq: Once | INTRAVENOUS | Status: AC | PRN
  Administered 2022-03-29: 100 mL via INTRAVENOUS

## 2022-03-29 MED ORDER — CLINDAMYCIN HCL 300 MG PO CAPS: 300.0000 mg | ORAL_CAPSULE | Freq: Three times a day (TID) | ORAL | 0 refills | Status: AC

## 2022-03-29 NOTE — ED Provider Notes (Signed)
[facility] EMERGENCY DEPARTMENT Provider Note   CSN: [id number] Arrival date & time: 03/29/22  1356     History  Chief Complaint  Patient presents with   Eye Pain    John Nolan is a 51 y.o. male with a past medical history of hypertension, DM 2, who presents today for evaluation of left eye pain and swelling.  His symptoms have been ongoing for approximately 6 days now. He was seen at ophthalmology with [facility] on 03/26/2022. He reports he has been taking his medications but his symptoms have worsened.  He states that when he looks around he feels discomfort in his left eye that is new over the past few days.  The swelling has worsened in both the upper and lower lids.  He denies any fevers and otherwise feels well.  States he started his antibiotics on 03/26/2022 and has been using them as directed.  No changes in vision per his report.  His pain in the eye its self isn't changed or worsened.    Per assessment and plan from provider note by Tsosie Billing MD on 03/26/22 "#Hordoleum, lower Lid, left eye  #Concern for possible tr preseptal cellulitis of upper lid  DDx: preseptal cellulitis, sebaceous carcinoma, pyogenic granuloma - dx 03/26/22, tr upper lid edema, lower lid edema and discharge. 3 day hx.  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 DDx: vernal conjunctivitis, GPC, SLK, toxic keratoconjunctivitis - Hx of obesity, sleep apnea, hx of T2DM (a1c 6.5)  - nightly lubricating ointment  - Recommend taping eyelids closed during sleep (or wearing eye shields) - Discussed avoiding sleeping face down - Consider surgical horizontal tightening of the eyelid for definitive treatment if conservative management fails  - Recommend follow-up with  PCP for evaluation/management of sleep apnea   #T2DM  - no evidence of retinopathy   Return in about 2 weeks (around 04/09/2022) for w/ Dr Wendall Mola (first), or with PGY2."    HPI     Home Medications Prior to Admission medications   Medication Sig Start Date End Date Taking? Authorizing Provider  clindamycin (CLEOCIN) 300 MG capsule Take 1 capsule (300 mg total) by mouth 3 (three) times daily for 7 days. 03/29/22 04/05/22 Yes Lorin Glass, PA-C  amLODipine-benazepril (LOTREL) 5-20 MG per capsule Take 1 capsule by mouth daily.      [provider]  cetirizine (ZYRTEC) 10 MG tablet 1 tablet    [provider]  hydrochlorothiazide (HYDRODIURIL) 25 MG tablet Take 1 tablet by mouth daily.    [provider]  metFORMIN (GLUCOPHAGE-XR) 500 MG 24 hr tablet Take 500 mg by mouth daily. 01/14/22   [provider]  Multiple Vitamins-Minerals (MENS MULTIVITAMIN) TABS 1 tablet    [provider]  naproxen sodium (ALEVE) 220 MG tablet 1 tablet as needed    [provider]  rosuvastatin (CRESTOR) 20 MG tablet Take 20 mg by mouth daily. 02/06/22   [provider]  valACYclovir (VALTREX) 500 MG tablet Take 500 mg by mouth as needed. 05/21/21   [provider]      Allergies    Patient has no known allergies.    Review of Systems  Review of Systems See above Physical Exam Updated Vital Signs BP 120/75   Pulse 87   Temp 98 F (36.7 C) (Oral)   Resp 16   SpO2 96%  Physical Exam Vitals and nursing note reviewed.  Constitutional:      General: He is not in acute distress. HENT:     Head: Normocephalic and atraumatic.  Eyes:     General:        Right eye: No discharge.        Left eye: Discharge present.    Extraocular Movements: Extraocular movements intact.     Conjunctiva/sclera:     Right eye: Right conjunctiva is injected (Minimal). No exudate.    Left eye: Left conjunctiva is injected. Exudate present.  No hemorrhage.    Pupils: Pupils are equal, round, and reactive to light.     Comments:  pain with EOMs on the left eye Left eye upper and lower lids are both edematous with scant discharge. While in the room patient is using a tissue and wiping/rubbing his eye about once a minute.  Cardiovascular:     Rate and Rhythm: Normal rate.  Pulmonary:     Effort: Pulmonary effort is normal. No respiratory distress.  Musculoskeletal:     Cervical back: Neck supple. No rigidity.  Neurological:     Mental Status: He is alert. Mental status is at baseline.     Comments: Awake and alert, answers all questions appropriately.  Speech is not slurred.    Psychiatric:        Mood and Affect: Mood normal.    ED Results / Procedures / Treatments   Labs (all labs ordered are listed, but only abnormal results are displayed) Labs Reviewed  BASIC METABOLIC PANEL - Abnormal; Notable for the following components:      Result Value   Glucose, Bld 153 (*)    All other components within normal limits  CBC WITH DIFFERENTIAL/PLATELET - Abnormal; Notable for the following components:   RBC 5.95 (*)    MCV 75.5 (*)    MCH 24.5 (*)    RDW 16.6 (*)    All other components within normal limits    EKG None  Radiology CT Orbits W Contrast  Result Date: 03/29/2022 CLINICAL DATA:  Worsening edema around the left eye. Orbital cellulitis suspected. EXAM: CT ORBITS WITH CONTRAST TECHNIQUE: Multidetector CT images was performed according to the standard protocol following intravenous contrast administration. RADIATION DOSE REDUCTION: This exam was performed according to the departmental dose-optimization program which includes automated exposure control, adjustment of the mA and/or kV according to patient size and/or use of iterative reconstruction technique. CONTRAST:  171m OMNIPAQUE IOHEXOL 350 MG/ML SOLN COMPARISON:  None FINDINGS: Orbits: Both globes are normal. Both optic nerves are normal. Extraocular muscles are  normal. Intraorbital fat is normal. There may be mild superficial preseptal edema in the periorbital regions, but there is no advanced CT finding. Visible paranasal sinuses: Clear except for minimal mucosal thickening of the maxillary sinus floors and in the anterior ethmoid region on the left. Soft tissues: Otherwise negative Osseous: Normal Limited intracranial: Normal IMPRESSION: No evidence of postseptal orbital inflammation. Question mild preseptal orbital edema, left more than right. No advanced CT finding. Electronically Signed   By: MNelson ChimesM.D.   On: 03/29/2022 16:34    Procedures Procedures    Medications Ordered in ED Medications  lactated ringers bolus 500 mL (0 mLs Intravenous Stopped 03/29/22 1734)  iohexol (OMNIPAQUE) 350 MG/ML injection  100 mL (100 mLs Intravenous Contrast Given 03/29/22 1630)    ED Course/ Medical Decision Making/ A&P Clinical Course as of 03/29/22 1902  Sat Mar 29, 2022  1436 CT Orbits W Contrast Mild preseptal left orbital edema without orbital cellulitis [EH]  1601 WBC: 4.6 [EH]  1602 Creatinine: 1.02 [EH]    Clinical Course User Index [EH] Lorin Glass, PA-C                           Medical Decision Making Patient is a 51 year old man who presents today for evaluation of left eye swelling.  He was recently seen by ophthalmology at [facility] for the same however reports he is not having improvement despite compliance with antibiotics. On exam he does have some mild pain with EOMs in the left eye. Labs are obtained and reviewed, please see ED course. Given failure to improve with antibiotics and now pain with EOM patient requires CT scan to evaluate for orbital cellulitis. CT scan was obtained showing mild preseptal edema without evidence of deep space infection or significant abnormalities. Patient is currently on Augmentin in addition to topical antibiotic. We will add in clindamycin in addition to the Augmentin. Additionally  discussed conservative care including strongly recommending that patient's stop aggressively wiping his eye as I suspect that that may be causing increased swelling.  Given that patient recently had his eye pressures evaluated by ophthalmology in addition to a full slit-lamp and eye exam this is not repeated today as patient denies any vision changes or other changes other than the mild increased swelling and failure to improve.  Return precautions were discussed with patient who states their understanding.  At the time of discharge patient denied any unaddressed complaints or concerns.  Patient is agreeable for discharge home.  Note: Portions of this report may have been transcribed using voice recognition software. Every effort was made to ensure accuracy; however, inadvertent computerized transcription errors may be present     Amount and/or Complexity of Data Reviewed Independent Historian: spouse External Data Reviewed: notes.    Details: Ophthalmology note Labs: ordered. Decision-making details documented in ED Course. Radiology: ordered and independent interpretation performed. Decision-making details documented in ED Course.  Risk Prescription drug management. Decision regarding hospitalization.           Final Clinical Impression(s) / ED Diagnoses Final diagnoses:  Preseptal cellulitis of left eye    Rx / DC Orders ED Discharge Orders          Ordered    clindamycin (CLEOCIN) 300 MG capsule  3 times daily        03/29/22 1706              Lorin Glass, Hershal Coria 03/29/22 [place], [place], DO 03/29/22 1916

## 2022-03-29 NOTE — ED Triage Notes (Signed)
Pt c/o eye pain. States that it started in the left eye and was seen at [facility] and they Rx'd abx. However it has not gotten any better and the right eye is starting to hurt and turn red.   Son has also been dx with pink eye and medications are not working for him either.

## 2022-03-29 NOTE — Discharge Instructions (Addendum)
Please keep taking your Augmentin, the previously prescribed antibiotic in addition to taking this new antibiotic and using the previously prescribed eye drops.  Today we discussed the contrast.  It is important that you are drinking plenty water and staying well-hydrated.  We discussed ways that you can monitor your blood sugar, and as long as your sugars are staying under 300s you may skip your metformin dose tomorrow.  You may have diarrhea from the antibiotics.  It is very important that you continue to take the antibiotics even if you get diarrhea unless a medical professional tells you that you may stop taking them.  If you stop too early the bacteria you are being treated for will become stronger and you may need different, more powerful antibiotics that have more side effects and worsening diarrhea.  Please stay well hydrated and consider probiotics as they may decrease the severity of your diarrhea.

## 2022-04-04 ENCOUNTER — Ambulatory Visit: Payer: Commercial Managed Care - HMO | Admitting: Cardiology

## 2022-04-25 ENCOUNTER — Ambulatory Visit: Payer: Commercial Managed Care - HMO | Admitting: Cardiology

## 2022-05-02 ENCOUNTER — Ambulatory Visit: Payer: Commercial Managed Care - HMO

## 2022-05-02 DIAGNOSIS — R072 Precordial pain: Secondary | ICD-10-CM

## 2022-05-19 ENCOUNTER — Encounter: Payer: Self-pay | Admitting: Cardiology

## 2022-05-19 ENCOUNTER — Ambulatory Visit: Payer: Commercial Managed Care - HMO | Admitting: Cardiology

## 2022-05-19 VITALS — BP 124/88 | HR 99 | Temp 97.2°F | Resp 16 | Ht 71.0 in | Wt 266.6 lb

## 2022-05-19 DIAGNOSIS — E119 Type 2 diabetes mellitus without complications: Secondary | ICD-10-CM

## 2022-05-19 DIAGNOSIS — F17210 Nicotine dependence, cigarettes, uncomplicated: Secondary | ICD-10-CM

## 2022-05-19 DIAGNOSIS — R0683 Snoring: Secondary | ICD-10-CM

## 2022-05-19 DIAGNOSIS — I10 Essential (primary) hypertension: Secondary | ICD-10-CM

## 2022-05-19 DIAGNOSIS — E1169 Type 2 diabetes mellitus with other specified complication: Secondary | ICD-10-CM

## 2022-05-19 DIAGNOSIS — R072 Precordial pain: Secondary | ICD-10-CM

## 2022-05-19 NOTE — Progress Notes (Signed)
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  Patient was referred to the practice for evaluation of precordial pain likely suggestive of noncardiac discomfort.  He is undergone echocardiogram and exercise treadmill stress test results reviewed with him and noted below for further reference.  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  Since last office visit, he denies any precordial pain.  He is also reestablish care with sleep medicine and is using his CPAP regularly.  No family history of premature coronary artery disease, sudden cardiac death, or cardiomyopathy.  FUNCTIONAL STATUS: No structured exercise program or daily routine.   ALLERGIES: No Known Allergies  MEDICATION LIST PRIOR TO VISIT: Current Meds  Medication Sig   amLODipine-benazepril (LOTREL) 5-20 MG per capsule Take 1 capsule by mouth daily.     cetirizine (ZYRTEC) 10 MG tablet 1 tablet   hydrochlorothiazide (HYDRODIURIL) 25 MG tablet Take 1 tablet by mouth daily.   metFORMIN (GLUCOPHAGE-XR) 500 MG 24 hr tablet Take 500 mg by mouth daily.   Multiple Vitamins-Minerals (MENS MULTIVITAMIN) TABS 1 tablet   naproxen sodium (ALEVE) 220 MG tablet 1 tablet as needed   rosuvastatin (CRESTOR) 20 MG tablet Take 20 mg by mouth daily.   valACYclovir (VALTREX) 500 MG tablet Take 500 mg by mouth as needed.     PAST  MEDICAL HISTORY: Past Medical History:  Diagnosis Date   Diabetes mellitus without complication ([place])    Hyperlipidemia    Hypertension    Nasal congestion     PAST SURGICAL HISTORY: Past Surgical History:  Procedure Laterality Date   MASS EXCISION  12/18/11   right antecubital fossa    FAMILY HISTORY: No family history of premature coronary disease or sudden cardiac death.  SOCIAL HISTORY:  The patient  reports that he has been smoking cigarettes. He has been smoking an average of .5 packs per day. He has never used smokeless tobacco. He reports current alcohol use. He reports that he does not use drugs.  REVIEW OF SYSTEMS: Review of Systems  Cardiovascular:  Negative for chest pain, dyspnea on exertion, leg swelling, near-syncope, orthopnea, palpitations, paroxysmal nocturnal dyspnea and syncope.  Respiratory:  Positive for snoring. Negative for shortness of breath.     PHYSICAL EXAM:    05/19/2022    9:45 AM 03/29/2022    4:45 PM 03/29/2022    2:05 PM  Vitals with BMI  Height '5\' 11"'$     Weight 266 lbs 10 oz    BMI 21.1    Systolic 941 740 814  Diastolic 88 75 94  Pulse 99 87 110    CONSTITUTIONAL: Well-developed and well-nourished. No acute distress.  SKIN: Skin is warm and dry. No rash noted. No cyanosis. No pallor. No jaundice HEAD: Normocephalic and atraumatic.  EYES: No scleral icterus MOUTH/THROAT: Moist oral membranes.  NECK: No JVD present. No thyromegaly noted. No carotid bruits  CHEST Normal respiratory effort.  No intercostal retractions  LUNGS: Clear to auscultation bilaterally.  No stridor. No wheezes. No rales.  CARDIOVASCULAR: Regular rate and rhythm, positive S1-S2, no murmurs rubs or gallops appreciated. ABDOMINAL: Obese, soft, nontender, nondistended, positive bowel sounds all 4 quadrants. No apparent ascites.  EXTREMITIES: No peripheral edema, warm to touch, +2 bilateral DP and PT pulses HEMATOLOGIC: No significant bruising NEUROLOGIC: Oriented to  person, place, and time. Nonfocal. Normal muscle tone.  PSYCHIATRIC: Normal mood and affect. Normal behavior. Cooperative No significant change in physical examination since last office visit.  CARDIAC DATABASE: EKG: 02/10/2022: Normal sinus rhythm, 81 bpm, poor R wave progression, without underlying ischemia injury pattern.  Echocardiogram: 02/10/2022: Left ventricle cavity is normal in size. Mild concentric hypertrophy of the left ventricle. Normal global wall motion. Normal LV systolic function with EF 57%. Normal diastolic filling pattern. No significant valvular abnormality. Normal right atrial pressure.    Stress Testing: Exercise treadmill stress test 05/02/2022: Exercise treadmill stress test performed using Bruce protocol. Patient reached 7.1 METS, and 87% of age predicted maximum heart rate. Exercise capacity was low. No chest pain reported. Normal heart rate and hemodynamic response. Stress EKG revealed no ischemic changes. Low risk study.   Heart Catheterization: None  LABORATORY DATA: Hemoglobin A1c   2022-01-21    eAG 143      Hgb A1c 6.6   4.8-5.6  CBC with Diff   2022-01-21    BA# 0.0   0.0-0.1  BA% 1.0   0.0-1.0  EO# 0.4   0.0-0.6  EO% 8.0   0.0-7.8  HCT 43.6   39.0-52.0  HGB 14.3   13.0-17.0  LY# 1.60   1.10-2.70  LY% 33.7   16.8-43.5  MCH 24.4   27.0-33.0  MCHC 32.7   32.0-36.0  MCV 74.7   80.0-94.0  MO# 0.6   0.3-0.8  MO% 12.5   4.6-12.4  MPV 8.7   7.5-10.7  NE# 2.1   1.9-7.2  NE% 44.8   43.3-71.9  NRBC# 0.03      NRBC% 0.50      PLT 165   150-400  RBC 5.84   4.20-5.80  RDW 15.8   11.5-15.5  WBC 4.6   1.6-10.9  Comp Metabolic Panel   6045-40-98    Albumin 4.5   3.4-4.8  ALP 76   38-126  ALT 27   0-52  Anion Gap 12.5   6.0-20.0  AST 23   0-39  BUN 16   6-26  CO2 28   22-32  CA-corrected 9.33   8.60-10.30  Calcium 9.8   8.6-10.3  Chloride 105   98-107  Creatinine 1.08   0.60-1.30  eGFR2021 84   >60  Glucose 107   70-99  Potassium 4.3    3.5-5.5  Sodium 141   136-145  Protein, Total 7.4   6.0-8.3  TBIL 0.4   0.3-1.0  Lipid Panel w/reflex   2022-01-21    LDL Chol Calc (NIH) 37   0-99  CHOL/HDL 3.6   2.0-4.0  Cholesterol 103   <200  HDLD 29   30-70  LDL Chol Calc (NIH) 37   0-99  NHDL 74   0-129  Triglyceride 235   0-199    IMPRESSION:    ICD-10-CM   1. Precordial pain  R07.2     2. Snoring  R06.83     3. Non-insulin dependent type 2 diabetes mellitus ([place])  E11.9     4. Benign hypertension  I10     5. Type  2 diabetes mellitus with hyperlipidemia (HCC)  E11.69    E78.5     6. Cigarette smoker  F17.210        RECOMMENDATIONS: John Nolan is a 51 y.o. African-American male whose past medical history and cardiac risk factors include: Hypertension, cigarette smoker, OSA, non-insulin-dependent diabetes mellitus type 2, hyperlipidemia.  Precordial pain Resolved. Noncardiac based on symptomatology; however, still underwent ischemic work-up given his risk factors including diabetes and his cigarette smoking.  Echo: Preserved LVEF, normal diastolic function, no significant valvular heart disease. GXT: Low risk. A1c and LDL well controlled. Reemphasized importance of complete smoking cessation. Has establish care with sleep medicine and now on CPAP.  Snoring Encouraged the importance of CPAP compliance. He will follow with his sleep provider.  Non-insulin dependent type 2 diabetes mellitus (HCC) Hemoglobin A1c well controlled. Currently on medical therapy. Currently managed by primary care provider.  Benign hypertension Office blood pressures are well controlled. Medications reconciled.  Type 2 diabetes mellitus with hyperlipidemia (HCC) A1c well controlled. Current medical therapy. Currently managed by primary care provider.  Cigarette smoker Tobacco cessation counseling: Currently smoking 0.5 packs/day   Patient is willing to quit at this time. 5 mins were spent counseling patient cessation  techniques. We discussed various methods to help quit smoking, including deciding on a date to quit, joining a support group, pharmacological agents- nicotine gum/patch/lozenges.  Patient states that he will discuss this further with PCP.  Patient is stable from a cardiovascular standpoint at this time.  His ischemic work-up was unremarkable.  Patient is educated on importance of improving his modifiable cardiovascular risk factors and to follow-up regularly with PCP.  Since there is no active cardiovascular comorbidities I would like to see him back on an as-needed basis.  Patient is agreeable.   FINAL MEDICATION LIST END OF ENCOUNTER: No orders of the defined types were placed in this encounter.   There are no discontinued medications.   Current Outpatient Medications:    amLODipine-benazepril (LOTREL) 5-20 MG per capsule, Take 1 capsule by mouth daily.  , Disp: , Rfl:    cetirizine (ZYRTEC) 10 MG tablet, 1 tablet, Disp: , Rfl:    hydrochlorothiazide (HYDRODIURIL) 25 MG tablet, Take 1 tablet by mouth daily., Disp: , Rfl:    metFORMIN (GLUCOPHAGE-XR) 500 MG 24 hr tablet, Take 500 mg by mouth daily., Disp: , Rfl:    Multiple Vitamins-Minerals (MENS MULTIVITAMIN) TABS, 1 tablet, Disp: , Rfl:    naproxen sodium (ALEVE) 220 MG tablet, 1 tablet as needed, Disp: , Rfl:    rosuvastatin (CRESTOR) 20 MG tablet, Take 20 mg by mouth daily., Disp: , Rfl:    valACYclovir (VALTREX) 500 MG tablet, Take 500 mg by mouth as needed., Disp: , Rfl:   No orders of the defined types were placed in this encounter.   There are no Patient Instructions on file for this visit.   --Continue cardiac medications as reconciled in final medication list. --Return if symptoms worsen or fail to improve. Or sooner if needed. --Continue follow-up with your primary care physician regarding the management of your other chronic comorbid conditions.  Patient's questions and concerns were addressed to his satisfaction. He voices  understanding of the instructions provided during this encounter.   This note was created using a voice recognition software as a result there may be grammatical errors inadvertently enclosed that do not reflect the nature of this encounter. Every attempt is made to correct such errors.  John Nolan Terri Skains, DO, [facility]  Pager:  [phone] Office: [phone]

## 2022-10-06 ENCOUNTER — Other Ambulatory Visit: Payer: Self-pay

## 2022-10-06 MED ORDER — HYDROCHLOROTHIAZIDE 25 MG PO TABS
25.0000 mg | ORAL_TABLET | Freq: Every day | ORAL | 2 refills | Status: DC
  Filled 2022-10-06: qty 90, 90d supply, fill #0
  Filled 2022-12-31: qty 90, 90d supply, fill #1
  Filled 2023-05-08 (×2): qty 90, 90d supply, fill #2

## 2022-10-06 MED ORDER — FLUTICASONE PROPIONATE 50 MCG/ACT NA SUSP
2.0000 | Freq: Two times a day (BID) | NASAL | 5 refills | Status: AC
  Filled 2022-10-06: qty 16, 30d supply, fill #0
  Filled 2022-12-31: qty 16, 30d supply, fill #1

## 2022-10-06 MED ORDER — ROSUVASTATIN CALCIUM 20 MG PO TABS
20.0000 mg | ORAL_TABLET | ORAL | 2 refills | Status: DC
  Filled 2022-10-06: qty 40, 90d supply, fill #0
  Filled 2022-12-31: qty 40, 90d supply, fill #1
  Filled 2023-05-08 (×2): qty 40, 90d supply, fill #2

## 2022-10-06 MED ORDER — VALACYCLOVIR HCL 500 MG PO TABS
500.0000 mg | ORAL_TABLET | Freq: Two times a day (BID) | ORAL | 1 refills | Status: AC | PRN
  Filled 2022-10-06: qty 12, 6d supply, fill #0

## 2022-10-06 MED ORDER — METFORMIN HCL ER 500 MG PO TB24
500.0000 mg | ORAL_TABLET | Freq: Every day | ORAL | 2 refills | Status: DC
  Filled 2022-10-06: qty 90, 90d supply, fill #0
  Filled 2022-12-31: qty 90, 90d supply, fill #1
  Filled 2023-05-08 (×2): qty 90, 90d supply, fill #2

## 2022-10-06 MED ORDER — AMLODIPINE BESY-BENAZEPRIL HCL 5-40 MG PO CAPS
1.0000 | ORAL_CAPSULE | Freq: Every day | ORAL | 2 refills | Status: DC
  Filled 2022-10-06: qty 90, 90d supply, fill #0
  Filled 2022-12-31: qty 90, 90d supply, fill #1
  Filled 2023-05-08: qty 90, 90d supply, fill #2

## 2022-10-07 ENCOUNTER — Other Ambulatory Visit: Payer: Self-pay

## 2022-11-17 DIAGNOSIS — Z23 Encounter for immunization: Secondary | ICD-10-CM | POA: Diagnosis not present

## 2023-02-07 DIAGNOSIS — G4733 Obstructive sleep apnea (adult) (pediatric): Secondary | ICD-10-CM | POA: Diagnosis not present

## 2023-03-09 DIAGNOSIS — G4733 Obstructive sleep apnea (adult) (pediatric): Secondary | ICD-10-CM | POA: Diagnosis not present

## 2023-04-06 ENCOUNTER — Other Ambulatory Visit: Payer: Self-pay

## 2023-04-06 DIAGNOSIS — J309 Allergic rhinitis, unspecified: Secondary | ICD-10-CM | POA: Diagnosis not present

## 2023-04-06 DIAGNOSIS — Z6835 Body mass index (BMI) 35.0-35.9, adult: Secondary | ICD-10-CM | POA: Diagnosis not present

## 2023-04-06 DIAGNOSIS — R718 Other abnormality of red blood cells: Secondary | ICD-10-CM | POA: Diagnosis not present

## 2023-04-06 DIAGNOSIS — E669 Obesity, unspecified: Secondary | ICD-10-CM | POA: Diagnosis not present

## 2023-04-06 DIAGNOSIS — G4733 Obstructive sleep apnea (adult) (pediatric): Secondary | ICD-10-CM | POA: Diagnosis not present

## 2023-04-06 DIAGNOSIS — F17210 Nicotine dependence, cigarettes, uncomplicated: Secondary | ICD-10-CM | POA: Diagnosis not present

## 2023-04-06 DIAGNOSIS — F17200 Nicotine dependence, unspecified, uncomplicated: Secondary | ICD-10-CM | POA: Diagnosis not present

## 2023-04-06 DIAGNOSIS — Z23 Encounter for immunization: Secondary | ICD-10-CM | POA: Diagnosis not present

## 2023-04-06 DIAGNOSIS — I10 Essential (primary) hypertension: Secondary | ICD-10-CM | POA: Diagnosis not present

## 2023-04-06 DIAGNOSIS — B009 Herpesviral infection, unspecified: Secondary | ICD-10-CM | POA: Diagnosis not present

## 2023-04-06 DIAGNOSIS — E782 Mixed hyperlipidemia: Secondary | ICD-10-CM | POA: Diagnosis not present

## 2023-04-06 DIAGNOSIS — E1169 Type 2 diabetes mellitus with other specified complication: Secondary | ICD-10-CM | POA: Diagnosis not present

## 2023-04-06 MED ORDER — VALACYCLOVIR HCL 500 MG PO TABS
500.0000 mg | ORAL_TABLET | Freq: Two times a day (BID) | ORAL | 1 refills | Status: AC
Start: 2023-04-06 — End: ?
  Filled 2023-04-06: qty 24, 12d supply, fill #0

## 2023-04-09 DIAGNOSIS — G4733 Obstructive sleep apnea (adult) (pediatric): Secondary | ICD-10-CM | POA: Diagnosis not present

## 2023-04-13 ENCOUNTER — Other Ambulatory Visit: Payer: Self-pay

## 2023-05-08 ENCOUNTER — Other Ambulatory Visit: Payer: Self-pay

## 2023-05-09 DIAGNOSIS — G4733 Obstructive sleep apnea (adult) (pediatric): Secondary | ICD-10-CM | POA: Diagnosis not present

## 2023-06-09 DIAGNOSIS — G4733 Obstructive sleep apnea (adult) (pediatric): Secondary | ICD-10-CM | POA: Diagnosis not present

## 2023-07-10 DIAGNOSIS — G4733 Obstructive sleep apnea (adult) (pediatric): Secondary | ICD-10-CM | POA: Diagnosis not present

## 2023-08-09 DIAGNOSIS — G4733 Obstructive sleep apnea (adult) (pediatric): Secondary | ICD-10-CM | POA: Diagnosis not present

## 2023-08-17 ENCOUNTER — Other Ambulatory Visit: Payer: Self-pay

## 2023-08-17 MED ORDER — ROSUVASTATIN CALCIUM 20 MG PO TABS
20.0000 mg | ORAL_TABLET | ORAL | 2 refills | Status: AC
  Filled 2023-08-17: qty 38, 88d supply, fill #0
  Filled 2023-12-02: qty 38, 88d supply, fill #1

## 2023-08-17 MED ORDER — METFORMIN HCL ER 500 MG PO TB24
500.0000 mg | ORAL_TABLET | Freq: Every day | ORAL | 2 refills | Status: AC
  Filled 2023-08-17: qty 90, 90d supply, fill #0
  Filled 2023-12-02: qty 90, 90d supply, fill #1

## 2023-08-17 MED ORDER — HYDROCHLOROTHIAZIDE 25 MG PO TABS
25.0000 mg | ORAL_TABLET | Freq: Every day | ORAL | 2 refills | Status: AC
  Filled 2023-08-17: qty 90, 90d supply, fill #0
  Filled 2023-12-02: qty 90, 90d supply, fill #1

## 2023-08-17 MED ORDER — AMLODIPINE BESY-BENAZEPRIL HCL 5-40 MG PO CAPS
1.0000 | ORAL_CAPSULE | Freq: Every day | ORAL | 2 refills | Status: AC
  Filled 2023-08-17: qty 90, 90d supply, fill #0
  Filled 2023-12-02: qty 90, 90d supply, fill #1

## 2023-08-24 ENCOUNTER — Other Ambulatory Visit: Payer: Self-pay

## 2023-09-09 DIAGNOSIS — G4733 Obstructive sleep apnea (adult) (pediatric): Secondary | ICD-10-CM | POA: Diagnosis not present

## 2023-10-05 ENCOUNTER — Other Ambulatory Visit: Payer: Self-pay

## 2023-10-05 DIAGNOSIS — J309 Allergic rhinitis, unspecified: Secondary | ICD-10-CM | POA: Diagnosis not present

## 2023-10-05 DIAGNOSIS — Z23 Encounter for immunization: Secondary | ICD-10-CM | POA: Diagnosis not present

## 2023-10-05 DIAGNOSIS — I10 Essential (primary) hypertension: Secondary | ICD-10-CM | POA: Diagnosis not present

## 2023-10-05 DIAGNOSIS — R718 Other abnormality of red blood cells: Secondary | ICD-10-CM | POA: Diagnosis not present

## 2023-10-05 DIAGNOSIS — Z125 Encounter for screening for malignant neoplasm of prostate: Secondary | ICD-10-CM | POA: Diagnosis not present

## 2023-10-05 DIAGNOSIS — Z1389 Encounter for screening for other disorder: Secondary | ICD-10-CM | POA: Diagnosis not present

## 2023-10-05 DIAGNOSIS — Z0000 Encounter for general adult medical examination without abnormal findings: Secondary | ICD-10-CM | POA: Diagnosis not present

## 2023-10-05 DIAGNOSIS — E669 Obesity, unspecified: Secondary | ICD-10-CM | POA: Diagnosis not present

## 2023-10-05 DIAGNOSIS — F17210 Nicotine dependence, cigarettes, uncomplicated: Secondary | ICD-10-CM | POA: Diagnosis not present

## 2023-10-05 DIAGNOSIS — E782 Mixed hyperlipidemia: Secondary | ICD-10-CM | POA: Diagnosis not present

## 2023-10-05 DIAGNOSIS — E1169 Type 2 diabetes mellitus with other specified complication: Secondary | ICD-10-CM | POA: Diagnosis not present

## 2023-10-05 MED ORDER — FLUTICASONE PROPIONATE 50 MCG/ACT NA SUSP
2.0000 | Freq: Two times a day (BID) | NASAL | 5 refills | Status: AC | PRN
  Filled 2023-10-05: qty 16, 30d supply, fill #0

## 2023-10-05 MED ORDER — VALACYCLOVIR HCL 500 MG PO TABS
500.0000 mg | ORAL_TABLET | Freq: Two times a day (BID) | ORAL | 1 refills | Status: AC | PRN
  Filled 2023-10-05: qty 24, 12d supply, fill #0

## 2023-10-05 MED ORDER — AMLODIPINE BESY-BENAZEPRIL HCL 5-40 MG PO CAPS
1.0000 | ORAL_CAPSULE | Freq: Every day | ORAL | 3 refills | Status: AC
  Filled 2023-10-05: qty 90, 90d supply, fill #0

## 2023-10-05 MED ORDER — HYDROCHLOROTHIAZIDE 25 MG PO TABS
25.0000 mg | ORAL_TABLET | Freq: Every day | ORAL | 3 refills | Status: AC
  Filled 2023-10-05: qty 90, 90d supply, fill #0

## 2023-10-05 MED ORDER — ROSUVASTATIN CALCIUM 20 MG PO TABS
20.0000 mg | ORAL_TABLET | Freq: Every day | ORAL | 3 refills | Status: AC
  Filled 2023-10-05: qty 40, 40d supply, fill #0

## 2023-10-05 MED ORDER — METFORMIN HCL ER 500 MG PO TB24
500.0000 mg | ORAL_TABLET | Freq: Every day | ORAL | 3 refills | Status: AC
  Filled 2023-10-05: qty 90, 90d supply, fill #0

## 2023-10-09 ENCOUNTER — Other Ambulatory Visit: Payer: Self-pay

## 2023-10-09 DIAGNOSIS — G4733 Obstructive sleep apnea (adult) (pediatric): Secondary | ICD-10-CM | POA: Diagnosis not present

## 2023-12-02 ENCOUNTER — Other Ambulatory Visit: Payer: Self-pay

## 2023-12-04 ENCOUNTER — Other Ambulatory Visit: Payer: Self-pay

## 2024-03-14 ENCOUNTER — Other Ambulatory Visit: Payer: Self-pay

## 2024-03-22 ENCOUNTER — Encounter: Payer: Self-pay | Admitting: Orthopaedic Surgery

## 2024-03-22 ENCOUNTER — Other Ambulatory Visit: Payer: Self-pay | Admitting: Family Medicine

## 2024-03-22 DIAGNOSIS — F17200 Nicotine dependence, unspecified, uncomplicated: Secondary | ICD-10-CM

## 2024-03-28 ENCOUNTER — Ambulatory Visit
Admission: RE | Admit: 2024-03-28 | Discharge: 2024-03-28 | Disposition: A | Discharge status: Home / Self Care | Source: Ambulatory Visit | Attending: Family Medicine | Admitting: Family Medicine

## 2024-03-28 DIAGNOSIS — F17200 Nicotine dependence, unspecified, uncomplicated: Secondary | ICD-10-CM
